# Patient Record
Sex: Male | Born: 1960 | Race: White | Hispanic: No | Marital: Single | Smoking: Current every day smoker
Health system: Southern US, Community
[De-identification: ages and names within clinical notes are randomized; demographics above are authoritative.]

---

## 2003-11-22 ENCOUNTER — Emergency Department: Payer: Self-pay | Admitting: Emergency Medicine

## 2008-04-21 ENCOUNTER — Emergency Department: Payer: Self-pay | Admitting: Internal Medicine

## 2008-04-23 ENCOUNTER — Ambulatory Visit: Payer: Self-pay | Admitting: Unknown Physician Specialty

## 2008-04-24 ENCOUNTER — Ambulatory Visit: Payer: Self-pay | Admitting: Unknown Physician Specialty

## 2013-02-17 ENCOUNTER — Emergency Department: Payer: Self-pay | Admitting: Emergency Medicine

## 2013-02-17 LAB — CBC
MCH: 29.4 pg (ref 26.0–34.0)
WBC: 12.3 10*3/uL — ABNORMAL HIGH (ref 3.8–10.6)

## 2013-02-17 LAB — URINALYSIS, COMPLETE
Bacteria: NONE SEEN
Blood: NEGATIVE
Ph: 7 (ref 4.5–8.0)
Protein: 30
RBC,UR: 2 /HPF (ref 0–5)
Specific Gravity: 1.025 (ref 1.003–1.030)

## 2013-02-17 LAB — COMPREHENSIVE METABOLIC PANEL
Alkaline Phosphatase: 85 U/L
Bilirubin,Total: 0.7 mg/dL (ref 0.2–1.0)
Calcium, Total: 9.2 mg/dL (ref 8.5–10.1)
Creatinine: 0.86 mg/dL (ref 0.60–1.30)
EGFR (African American): >60
EGFR (Non-African Amer.): >60
Glucose: 103 mg/dL — ABNORMAL HIGH (ref 65–99)
SGOT(AST): 19 U/L (ref 15–37)
SGPT (ALT): 19 U/L (ref 12–78)
Total Protein: 7.2 g/dL (ref 6.4–8.2)

## 2013-02-17 LAB — LIPASE, BLOOD: Lipase: 170 U/L (ref 73–393)

## 2014-11-20 IMAGING — CT CT ABD-PELV W/ CM
2 of 5 series · 15 of 46 positions shown, 17 images · IV contrast (isovue)
Comparison: None.

CLINICAL DATA: Back pain

EXAM:
CT ABDOMEN AND PELVIS WITH CONTRAST
TECHNIQUE: Multidetector CT imaging of the abdomen and pelvis was performed
using the standard protocol following bolus administration of
intravenous contrast.
CONTRAST:  100 cc of Isovue 300

[Series 2: routine abd pel with · axial · 0.62mm/px · z∈[-434,-64]mm · 12 of 84 slices shown, 14 images]
[im 5/84  soft-tissue]
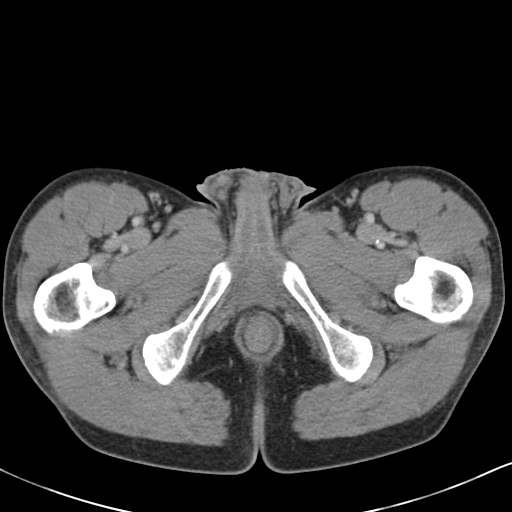
[im 5/84  bone]
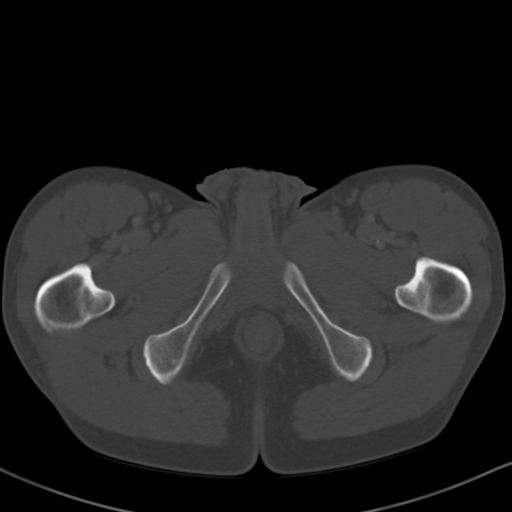
[im 14/84  soft-tissue]
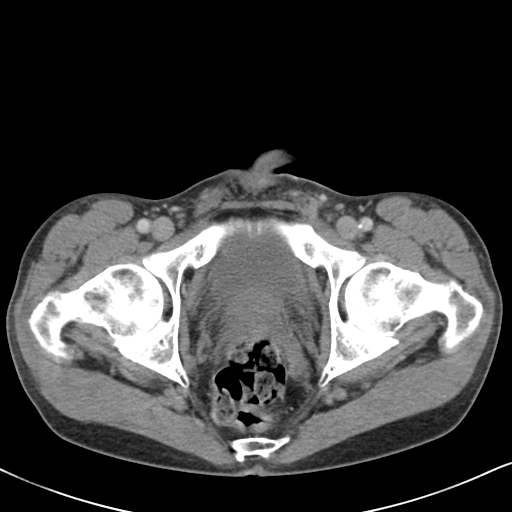
[im 18/84  soft-tissue]
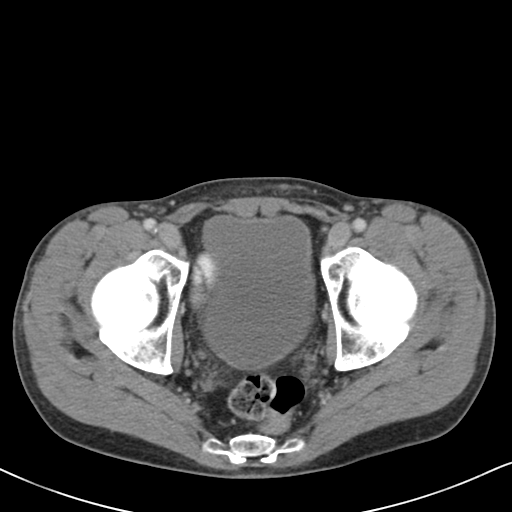
[im 27/84  soft-tissue]
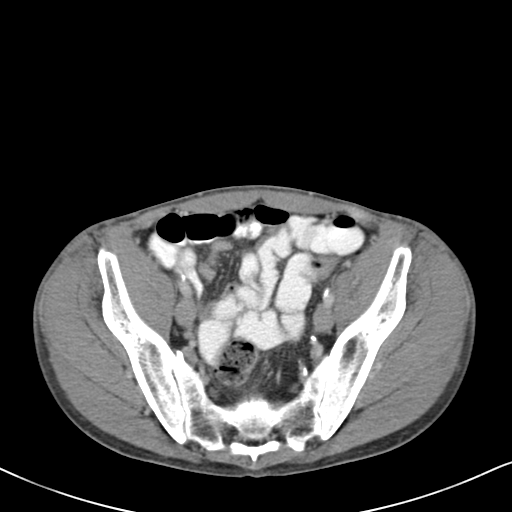
[im 31/84  soft-tissue]
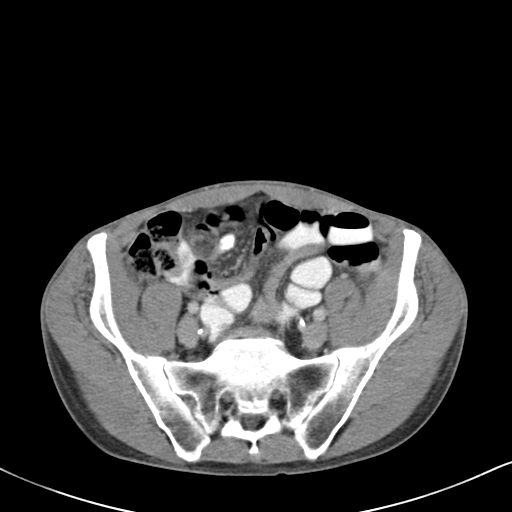
[im 40/84  soft-tissue]
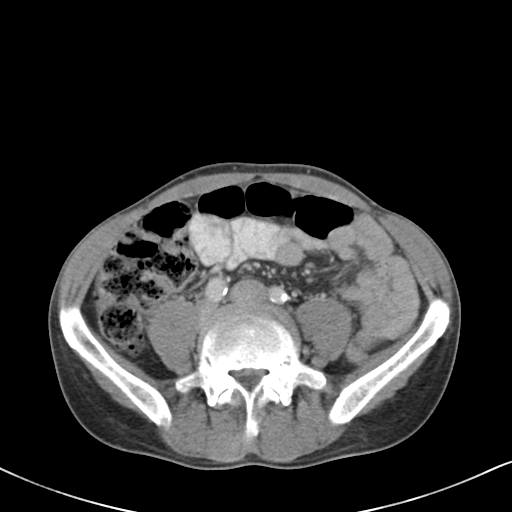
[im 44/84  soft-tissue]
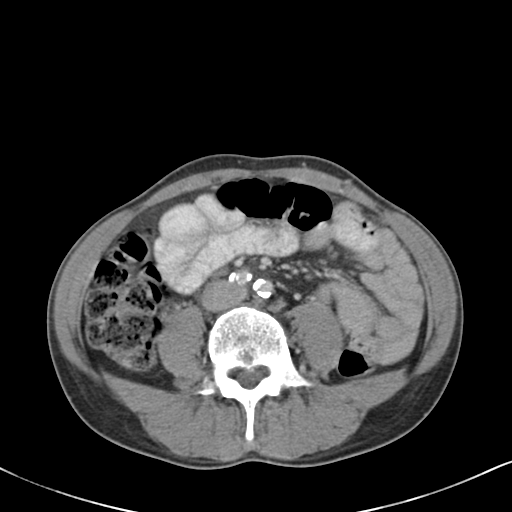
[im 53/84  soft-tissue]
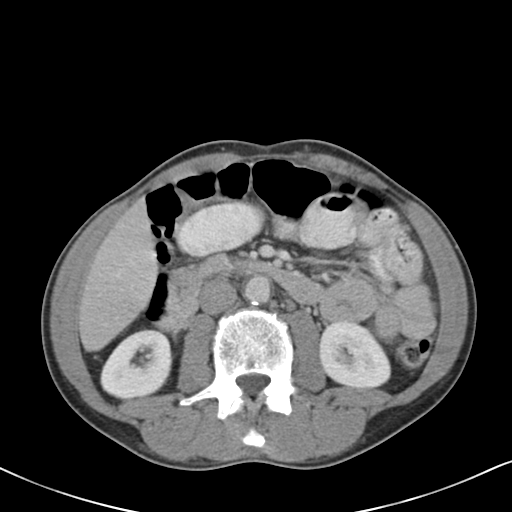
[im 57/84  soft-tissue]
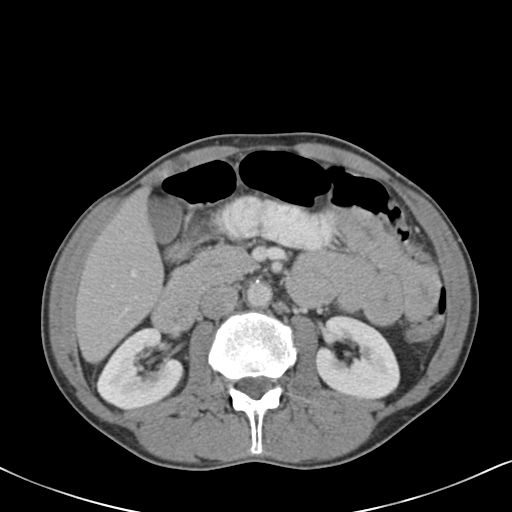
[im 57/84  bone]
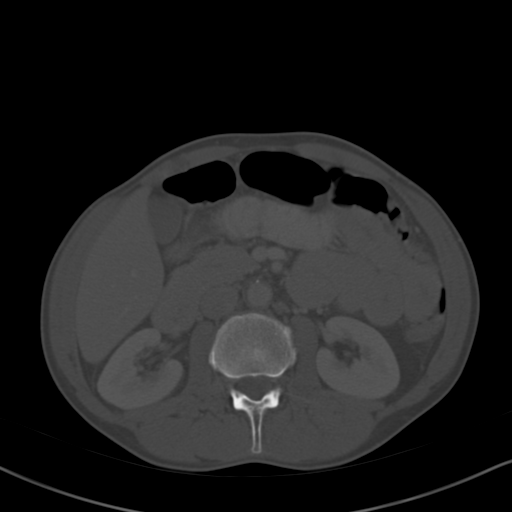
[im 66/84  soft-tissue]
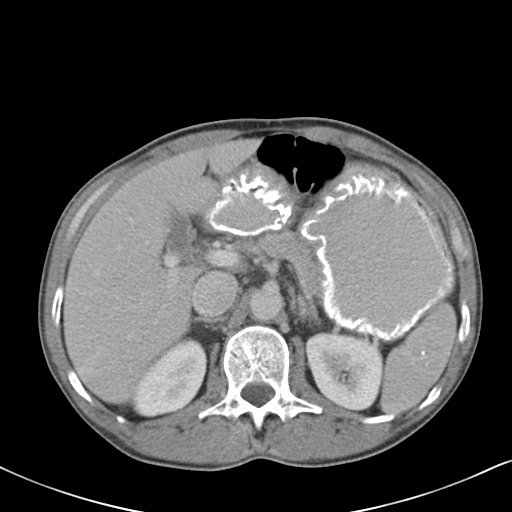
[im 70/84  soft-tissue]
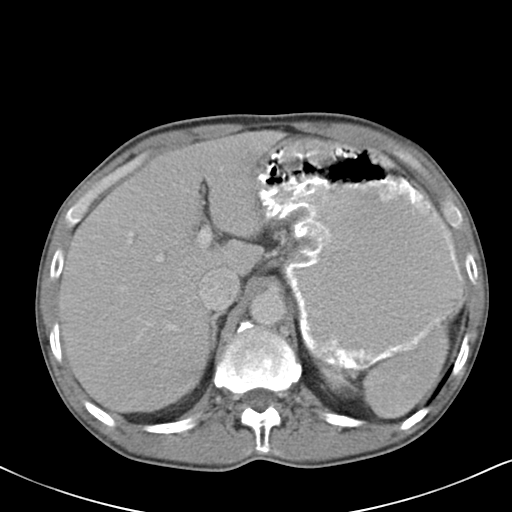
[im 79/84  soft-tissue]
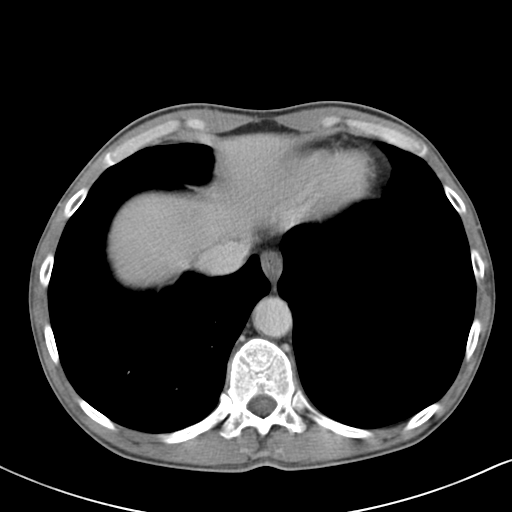

[Series 5: cor routine abd pel with · coronal · 0.57mm/px · 3 of 115 slices shown]
[im 39/115  soft-tissue]
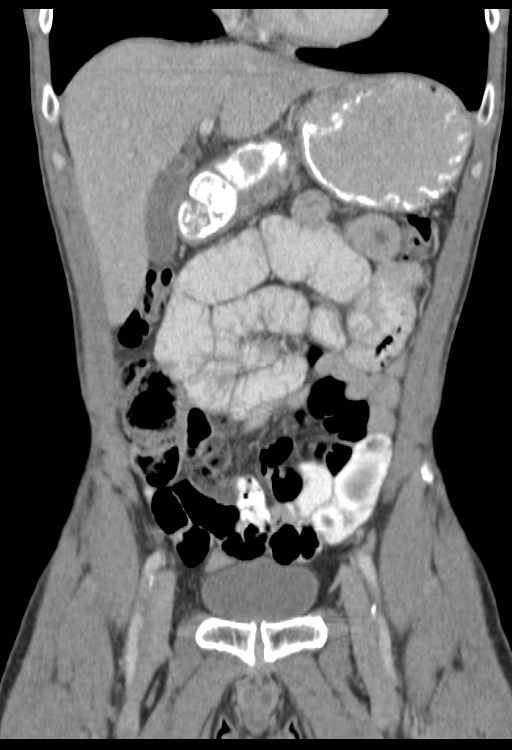
[im 51/115  soft-tissue]
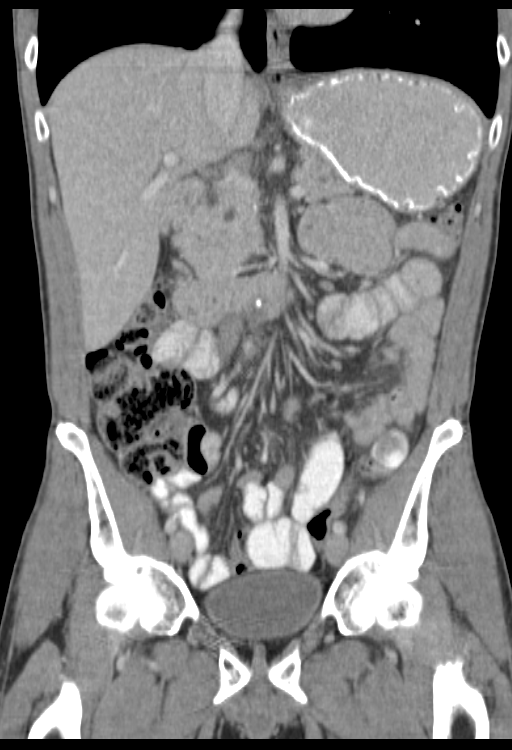
[im 64/115  soft-tissue]
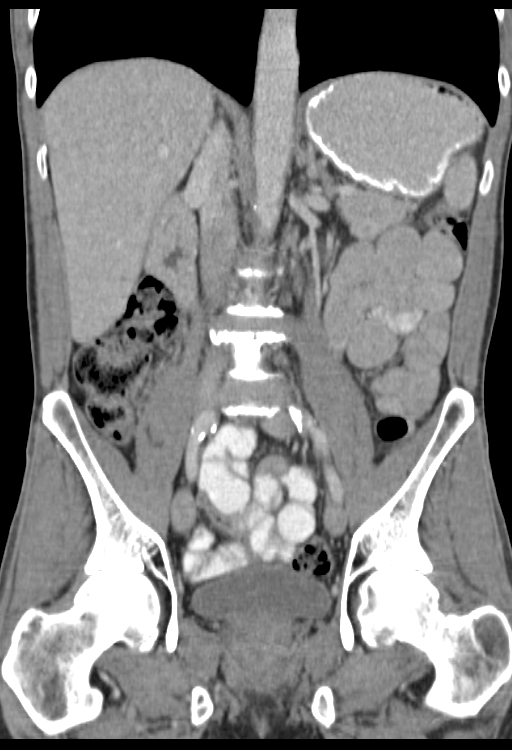

[15 of 46 positions shown; findings below may reference images not displayed]

FINDINGS: The lung bases are clear.  No pleural or pericardial effusion.

Low attenuation structure in the right hepatic lobe is too small to
characterize measuring 5 mm, image 10/series 2. Inferior right
hepatic lobe hypodensity measures 8 mm, image 24/ series 2. This is
also too small to characterize. The gallbladder appears normal. No
biliary dilatation. Normal appearance of the pancreas. The
pancreatic duct is upper limits of normal in caliber measuring 3 mm.
There is a tiny calcification in the region of the distal pancreatic
duct, image 27/series 2. Multiple calcified granulomas noted within
the spleen.

The adrenal glands are both normal. Normal appearance of both
kidneys. The urinary bladder is normal. Prostate gland and seminal
vesicles are unremarkable.

There is calcified atherosclerotic disease involving the abdominal
aorta. No aneurysm. No upper abdominal adenopathy noted. There is no
pelvic or inguinal adenopathy identified.

There is no free fluid or abnormal fluid collections within the
abdomen or pelvis. The stomach appears normal. The small bowel loops
have a normal course and caliber without evidence for bowel
obstruction. The appendix is visualized and appears normal. Normal
appearance of the colon.

Review of the visualized bony structures is significant for lumbar
degenerative disc disease.
IMPRESSION: 1. No acute findings.
2. The pancreatic duct is upper limits of normal in size measuring
2.9 mm. There is a calcification within the head of pancreas near
the distal pancreatic duct but this is felt a likely be secondary to
prior granulomatous disease as there granulomas noted in the liver
as well as the spleen.
3. Low-attenuation foci within the liver are too small to
characterize.
4. Atherosclerotic disease
5. Degenerative disc disease in the lumbar spine.

## 2017-01-25 ENCOUNTER — Emergency Department
Admission: EM | Admit: 2017-01-25 | Discharge: 2017-01-25 | Disposition: A | Discharge status: Home / Self Care | Payer: Self-pay | Attending: Emergency Medicine | Admitting: Emergency Medicine

## 2017-01-25 ENCOUNTER — Encounter: Payer: Self-pay | Admitting: Emergency Medicine

## 2017-01-25 ENCOUNTER — Emergency Department: Payer: Self-pay

## 2017-01-25 DIAGNOSIS — R0781 Pleurodynia: Secondary | ICD-10-CM | POA: Insufficient documentation

## 2017-01-25 DIAGNOSIS — W19XXXA Unspecified fall, initial encounter: Secondary | ICD-10-CM

## 2017-01-25 DIAGNOSIS — F17210 Nicotine dependence, cigarettes, uncomplicated: Secondary | ICD-10-CM | POA: Insufficient documentation

## 2017-01-25 MED ORDER — LIDOCAINE 5 % EX PTCH: 1.0000 | MEDICATED_PATCH | Freq: Two times a day (BID) | CUTANEOUS | 0 refills | Status: AC

## 2017-01-25 MED ORDER — LIDOCAINE 5 % EX PTCH
1.0000 | MEDICATED_PATCH | CUTANEOUS | Status: DC
  Administered 2017-01-25: 1 via TRANSDERMAL
  Filled 2017-01-25: qty 1

## 2017-01-25 MED ORDER — OXYCODONE-ACETAMINOPHEN 5-325 MG PO TABS
1.0000 | ORAL_TABLET | Freq: Once | ORAL | Status: AC
  Administered 2017-01-25: 1 via ORAL
  Filled 2017-01-25: qty 1

## 2017-01-25 MED ORDER — OXYCODONE-ACETAMINOPHEN 5-325 MG PO TABS: 1.0000 | ORAL_TABLET | Freq: Four times a day (QID) | ORAL | 0 refills | Status: AC | PRN

## 2017-01-25 NOTE — ED Provider Notes (Signed)
[facility] Emergency Department Provider Note  ____________________________________________  Time seen: Approximately 4:41 PM  I have reviewed the triage vital signs and the nursing notes.   HISTORY  Chief Complaint Back Pain and Fall    HPI Dylan Ramirez is a 56 y.o. male that present to the emergency department for evaluation of left rib pain after falling onto a fire place this afternoon.  Patient states that he was standing on a table when he lost his footing and hit the fireplace.  He did not hit his head or lose consciousness.  Pain is worse with deep breathing.  No additional injuries.  No chest pain, nausea, vomiting, abdominal pain.  History reviewed. No pertinent past medical history.  There are no active problems to display for this patient.   History reviewed. No pertinent surgical history.  Prior to Admission medications   Medication Sig Start Date End Date Taking? Authorizing Provider  lidocaine (LIDODERM) 5 % Place 1 patch onto the skin every 12 (twelve) hours. Remove & Discard patch within 12 hours or as directed by MD 01/25/17 01/25/18  Enid DerryWagner, Zaiya Annunziato, PA-C  oxyCODONE-acetaminophen (ROXICET) 5-325 MG tablet Take 1 tablet by mouth every 6 (six) hours as needed for up to 3 days. 01/25/17 01/28/17  Enid DerryWagner, Osinachi Navarrette, PA-C    Allergies Patient has no known allergies.  No family history on file.  Social History Social History   Tobacco Use  . Smoking status: Current Every Day Smoker    Packs/day: 1.00    Types: Cigarettes  . Smokeless tobacco: Never Used  Substance Use Topics  . Alcohol use: No    Frequency: Never  . Drug use: No     Review of Systems  Gastrointestinal: No abdominal pain.  No nausea, no vomiting.  Musculoskeletal: Positive for rib pain. Skin: Negative for rash, abrasions, lacerations, ecchymosis. Neurological: Negative for headaches, numbness or tingling   ____________________________________________   PHYSICAL  EXAM:  VITAL SIGNS: ED Triage Vitals [01/25/17 1521]  Enc Vitals Group     BP (!) 147/73     Pulse Rate (!) 55     Resp 16     Temp 98.5 F (36.9 C)     Temp Source Oral     SpO2 99 %     Weight 132 lb (59.9 kg)     Height 5\' 10"  (1.778 m)     Head Circumference      Peak Flow      Pain Score 8     Pain Loc      Pain Edu?      Excl. in GC?      Constitutional: Alert and oriented. Well appearing and in no acute distress. Eyes: Conjunctivae are normal. PERRL. EOMI. Head: Atraumatic. ENT:      Ears:      Nose: No congestion/rhinnorhea.      Mouth/Throat: Mucous membranes are moist.  Neck: No stridor.   Cardiovascular: Normal rate, regular rhythm.  Good peripheral circulation. Respiratory: Normal respiratory effort without tachypnea or retractions. Lungs CTAB. Good air entry to the bases with no decreased or absent breath sounds. Gastrointestinal: Bowel sounds 4 quadrants. Soft and nontender to palpation. No guarding or rigidity. No palpable masses. No distention.  Musculoskeletal: Full range of motion to all extremities. No gross deformities appreciated.  Tenderness to palpation over left lateral rib cage.  No ecchymosis. Neurologic:  Normal speech and language. No gross focal neurologic deficits are appreciated.  Skin:  Skin is warm,  dry and intact. No rash noted.   ____________________________________________   LABS (all labs ordered are listed, but only abnormal results are displayed)  Labs Reviewed - No data to display ____________________________________________  EKG   ____________________________________________  RADIOLOGY Lexine BatonI, Elven Laboy, personally viewed and evaluated these images (plain radiographs) as part of my medical decision making, as well as reviewing the written report by the radiologist.  Dg Ribs Unilateral W/chest Left  Result Date: 01/25/2017 CLINICAL DATA:  Larey SeatFell and struck a fireplace, LEFT posterior lower rib pain EXAM: LEFT RIBS AND  CHEST - 3+ VIEW COMPARISON:  None FINDINGS: Normal heart size, mediastinal contours, and pulmonary vascularity. Suspect calcified lymph nodes at RIGHT hilum and calcified granuloma at lower RIGHT chest. Peribronchial thickening with mild LEFT basilar atelectasis. No acute infiltrate, pleural effusion or pneumothorax. BB placed at site of symptoms lower LEFT chest. Osseous mineralization grossly normal for technique. No definite rib fracture or bone destruction seen. IMPRESSION: Mild bronchitic changes and LEFT basilar atelectasis. No definite acute LEFT rib abnormalities. Electronically Signed   By: Ulyses SouthwardMark  Boles M.D.   On: 01/25/2017 16:23    ____________________________________________    PROCEDURES  Procedure(s) performed:    Procedures    Medications  lidocaine (LIDODERM) 5 % 1 patch (1 patch Transdermal Patch Applied 01/25/17 1702)  oxyCODONE-acetaminophen (PERCOCET/ROXICET) 5-325 MG per tablet 1 tablet (1 tablet Oral Given 01/25/17 1702)     ____________________________________________   INITIAL IMPRESSION / ASSESSMENT AND PLAN / ED COURSE  Pertinent labs & imaging results that were available during my care of the patient were reviewed by me and considered in my medical decision making (see chart for details).  Review of the [place] CSRS was performed in accordance of the [facility] prior to dispensing any controlled drugs.    Patient's diagnosis is consistent with rib contusion.  Vital signs and exam are reassuring.  Rib and chest x-ray negative for acute processes.  He did not hit his head or lose consciousness.  He has no additional complaints or concerns at this time.  Lidoderm and Percocet were given.  Patient will be discharged home with prescriptions for Lidoderm and a short course of Percocet. Patient is to follow up with PCP as directed. Patient is given ED precautions to return to the ED for any worsening or new symptoms.    ____________________________________________  FINAL  CLINICAL IMPRESSION(S) / ED DIAGNOSES  Final diagnoses:  Fall, initial encounter  Rib pain on left side      NEW MEDICATIONS STARTED DURING THIS VISIT:  ED Discharge Orders        Ordered    lidocaine (LIDODERM) 5 %  Every 12 hours     01/25/17 1716    oxyCODONE-acetaminophen (ROXICET) 5-325 MG tablet  Every 6 hours PRN     01/25/17 1716          This chart was dictated using voice recognition software/Dragon. Despite best efforts to proofread, errors can occur which can change the meaning. Any change was purely unintentional.    Enid DerryWagner, Vihaan Gloss, PA-C 01/25/17 1802    Pershing ProudSchaevitz, Myra Rudeavid Matthew, MD 01/25/17 [phone]

## 2017-01-25 NOTE — ED Notes (Signed)
Pt c/o back pain from fall. Denies LOC. Ambulatory without assistance from wheelchair to bed.

## 2017-01-25 NOTE — ED Triage Notes (Addendum)
Pt to ED via EMS states he had mechanical fall onto a fireplace while working, denies any head injuries or LOC. Pt c/o LFT side back/rib cage area pain. PT A&Ox4. RR even and unlabored. VS stable
